# Patient Record
Sex: Female | Born: 1960 | Race: White | Hispanic: No | Marital: Married | Smoking: Never smoker
Health system: Southern US, Community
[De-identification: ages and names within clinical notes are randomized; demographics above are authoritative.]

## PROBLEM LIST (undated history)

## (undated) DIAGNOSIS — F329 Major depressive disorder, single episode, unspecified: Secondary | ICD-10-CM

## (undated) DIAGNOSIS — F32A Depression, unspecified: Secondary | ICD-10-CM

## (undated) DIAGNOSIS — F419 Anxiety disorder, unspecified: Secondary | ICD-10-CM

## (undated) DIAGNOSIS — J45909 Unspecified asthma, uncomplicated: Secondary | ICD-10-CM

## (undated) DIAGNOSIS — F4310 Post-traumatic stress disorder, unspecified: Secondary | ICD-10-CM

## (undated) HISTORY — DX: Unspecified asthma, uncomplicated: J45.909

## (undated) HISTORY — DX: Major depressive disorder, single episode, unspecified: F32.9

## (undated) HISTORY — DX: Post-traumatic stress disorder, unspecified: F43.10

## (undated) HISTORY — DX: Anxiety disorder, unspecified: F41.9

## (undated) HISTORY — DX: Depression, unspecified: F32.A

---

## 2019-01-25 ENCOUNTER — Other Ambulatory Visit: Payer: Self-pay | Admitting: Family Medicine

## 2019-01-25 ENCOUNTER — Other Ambulatory Visit (HOSPITAL_COMMUNITY): Payer: Self-pay | Admitting: Family Medicine

## 2019-01-25 DIAGNOSIS — R42 Dizziness and giddiness: Secondary | ICD-10-CM

## 2019-01-26 ENCOUNTER — Other Ambulatory Visit (HOSPITAL_COMMUNITY): Payer: Self-pay | Admitting: Family Medicine

## 2019-01-26 ENCOUNTER — Ambulatory Visit (HOSPITAL_COMMUNITY)
Admission: RE | Admit: 2019-01-26 | Discharge: 2019-01-26 | Disposition: A | Discharge status: Home / Self Care | Payer: BLUE CROSS/BLUE SHIELD | Source: Ambulatory Visit | Attending: Family Medicine | Admitting: Family Medicine

## 2019-01-26 ENCOUNTER — Other Ambulatory Visit: Payer: Self-pay

## 2019-01-26 ENCOUNTER — Other Ambulatory Visit: Payer: Self-pay | Admitting: Family Medicine

## 2019-01-26 DIAGNOSIS — R42 Dizziness and giddiness: Secondary | ICD-10-CM

## 2019-02-25 ENCOUNTER — Other Ambulatory Visit (HOSPITAL_COMMUNITY): Payer: Self-pay

## 2020-08-16 IMAGING — MR MRI HEAD WITHOUT CONTRAST
8 of 11 series · 24 of 48 positions shown · non-contrast
Comparison: None.

CLINICAL DATA: Bilateral weakness over the last 2 days.

EXAM:
MRI HEAD WITHOUT CONTRAST
MRA HEAD WITHOUT CONTRAST
TECHNIQUE: Multiplanar, multiecho pulse sequences of the brain and surrounding
structures were obtained without intravenous contrast. Angiographic
images of the head were obtained using MRA technique without
contrast.

[Series 2: DWI · axial · 3.0mm · 0.75mm/px · z∈[-82,+80]mm · 6 of 56 slices shown (1 of 2)]
[im 1/56]
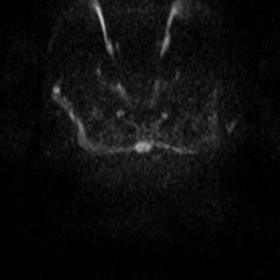
[im 12/56]
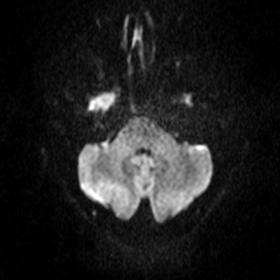
[im 23/56]
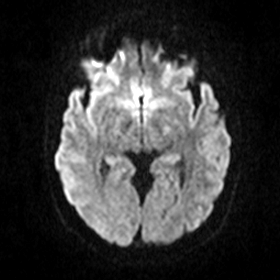
[im 34/56]
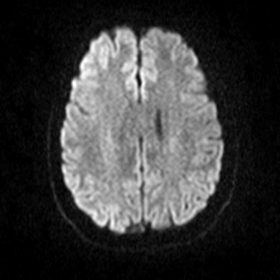
[im 45/56]
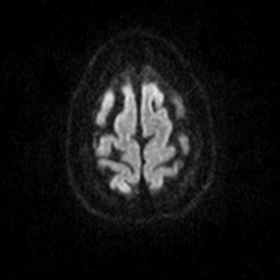
[im 56/56]
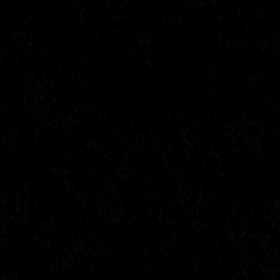

[Series 4: DWI · coronal · 5.0mm · 0.49mm/px · 3 of 39 slices shown (2 of 2)]
[im 1/39]
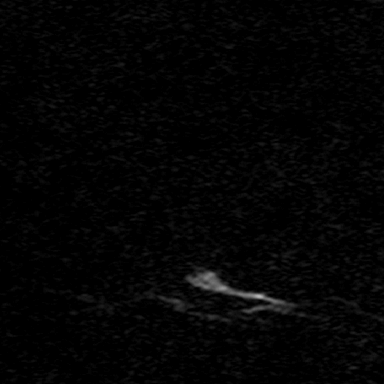
[im 20/39]
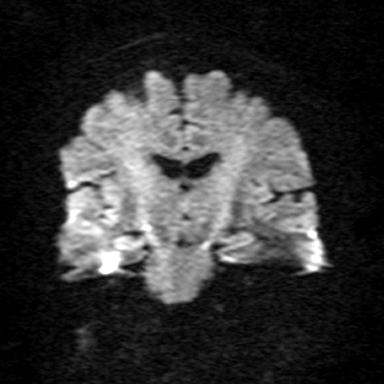
[im 39/39]
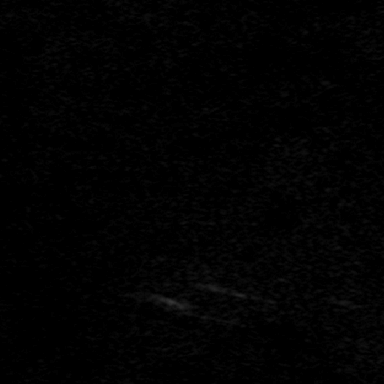

[Series 6: T1 · sagittal · 5.0mm · 0.40mm/px · 2 of 21 slices shown (1 of 2)]
[im 1/21]
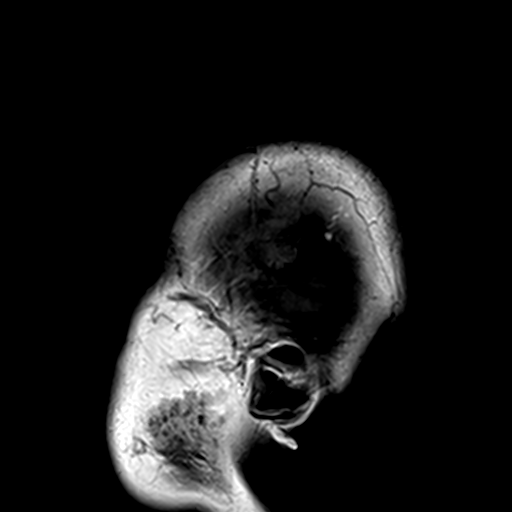
[im 21/21]
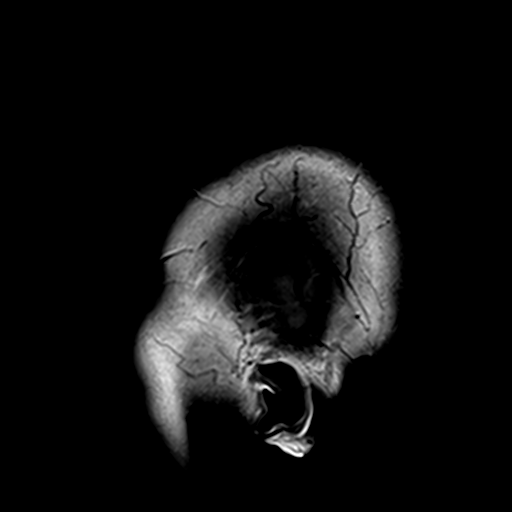

[Series 7: T2 · axial · 5.0mm · 0.48mm/px · z∈[-73,+70]mm · 2 of 23 slices shown (1 of 3)]
[im 1/23]
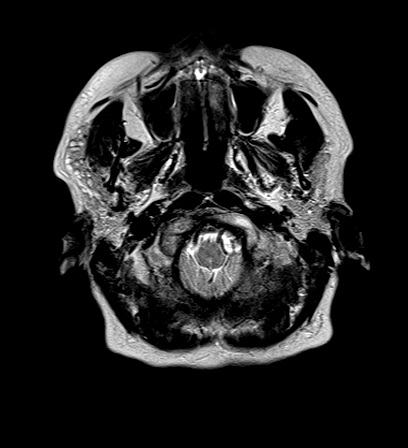
[im 23/23]
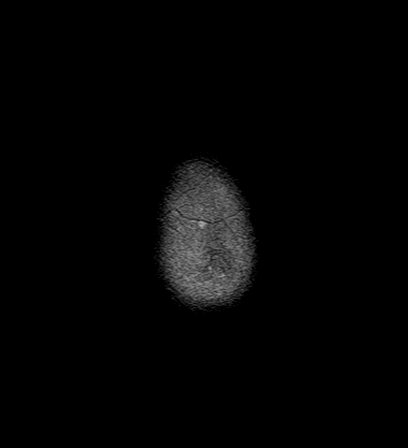

[Series 9: FLAIR · axial · 3.0mm · 0.32mm/px · z∈[-70,+68]mm · 4 of 47 slices shown]
[im 1/47]
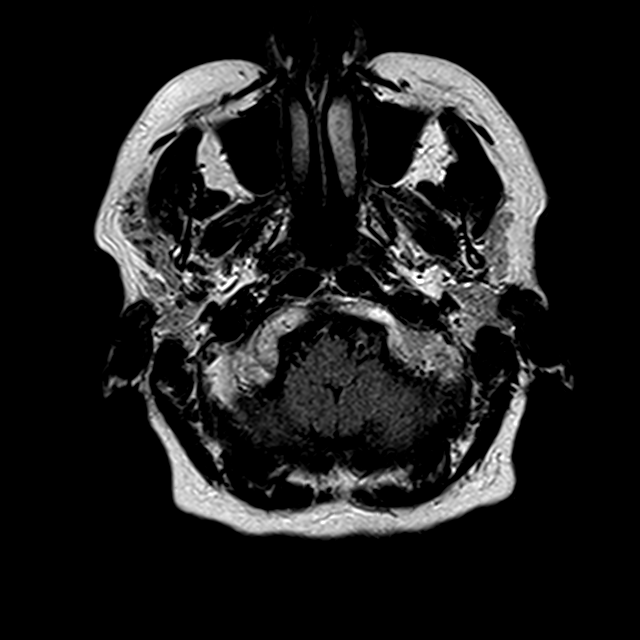
[im 16/47]
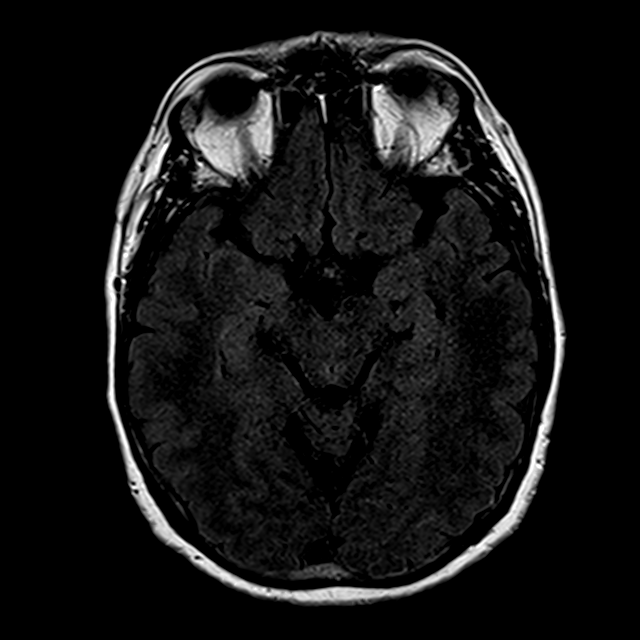
[im 31/47]
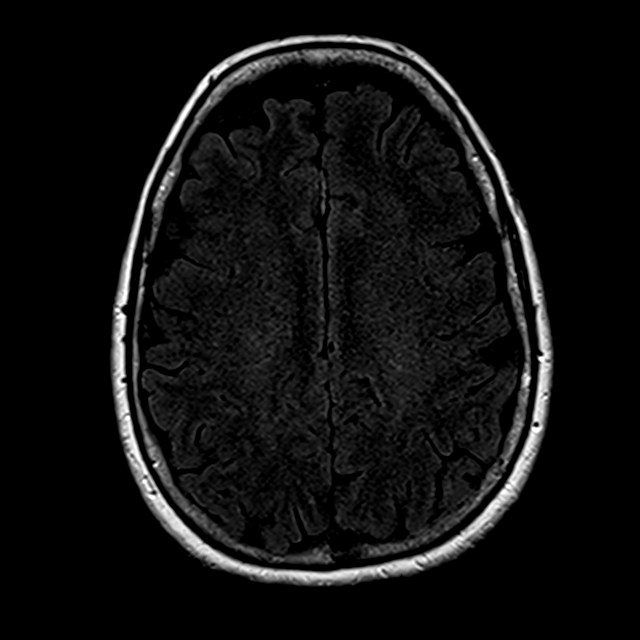
[im 47/47]
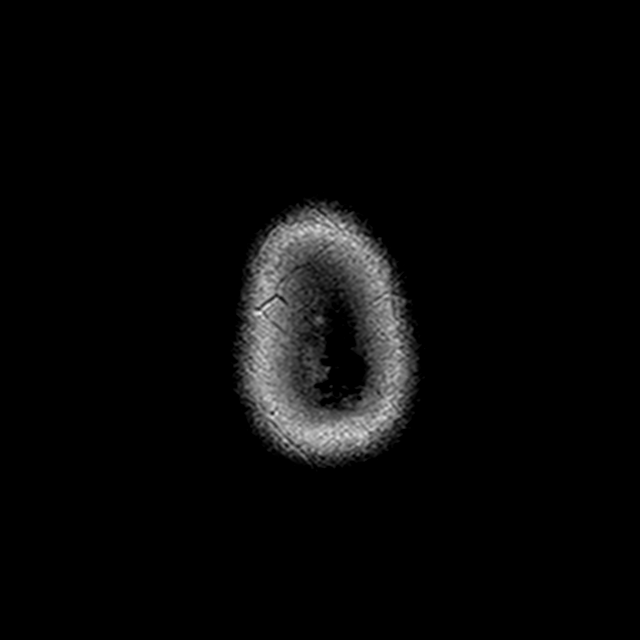

[Series 14: T1 · axial · 2.0mm · 0.41mm/px · z∈[-84,-32]mm · 3 of 80 slices shown (2 of 2)]
[im 1/80]
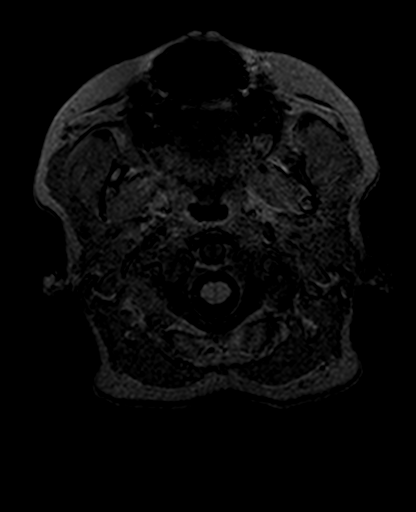
[im 14/80]
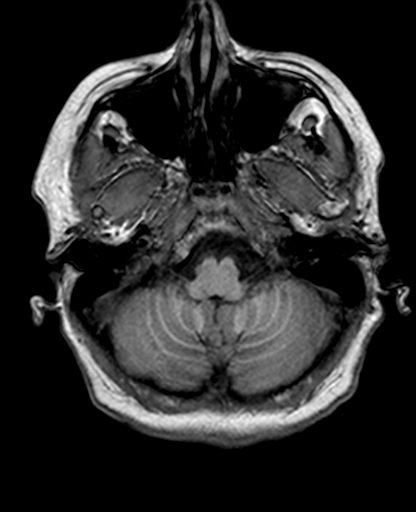
[im 27/80]
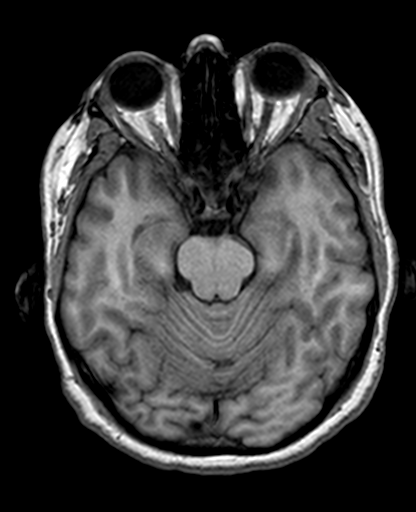

[Series 15: T2 · coronal · 5.0mm · 0.46mm/px · 2 of 28 slices shown (2 of 3)]
[im 1/28]
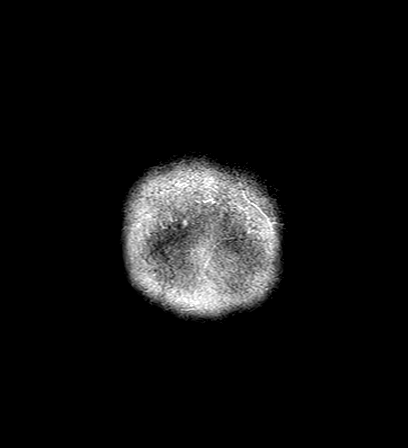
[im 28/28]
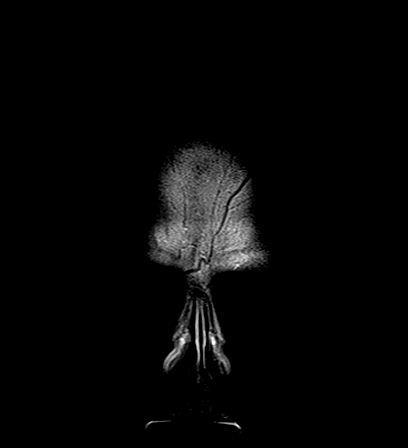

[Series 17: T2 · axial · 4.0mm · 0.42mm/px · z∈[-83,+47]mm · 2 of 27 slices shown (3 of 3)]
[im 1/27]
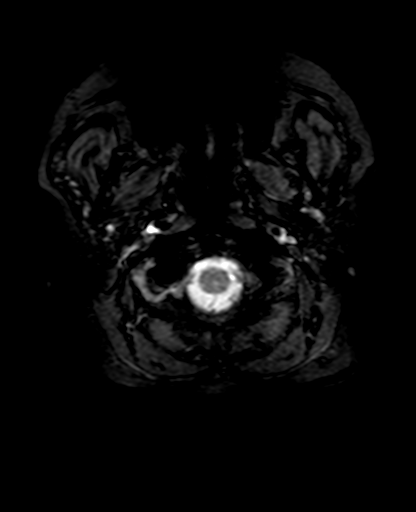
[im 27/27]
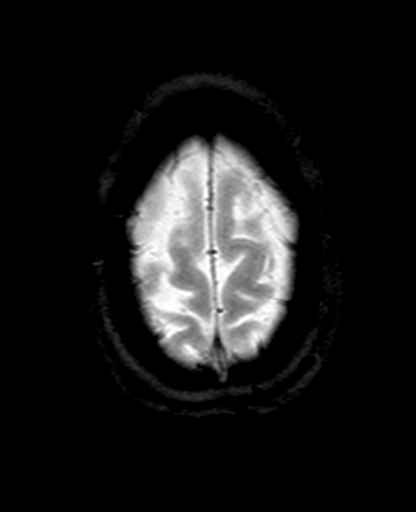

[24 of 48 positions shown; findings below may reference images not displayed]

FINDINGS: MRI HEAD FINDINGS

Brain: Diffusion imaging shows a 2 cm acute infarction of the left
posterior parietal cortical and subcortical brain. Evidence of mass
effect or hemorrhage. No other acute infarction. Brainstem and
cerebellum are normal. Cerebral hemispheres otherwise do not show
any old small or large vessel infarctions. No intra-axial mass
lesion. There is a 14 mm round meningioma at the left frontal
paramedian vertex. Slight indentation of the brain but no
significant mass effect or edema. No hydrocephalus or extra-axial
collection.

Vascular: Major vessels at the base of the brain show flow.

Skull and upper cervical spine: Negative

Sinuses/Orbits: Clear/normal

Other: None

MRA HEAD FINDINGS

Both internal carotid arteries are widely patent through the skull
base and siphon regions. The anterior and middle cerebral vessels
are patent without proximal stenosis, aneurysm or vascular
malformation. No missing large or medium vessels are seen.

Both vertebral arteries are patent to the basilar. No basilar
stenosis. Posterior circulation branch vessels show flow.
IMPRESSION: 2 cm acute infarction in the left posterior parietal/temporoparietal
junction region cortical and subcortical brain. No mass effect or
hemorrhage. Otherwise normal appearance of the brain parenchyma.
This is consistent with embolic infarction in the left MCA
territory.

1.4 cm meningioma of the left frontal paramedian vertex. Minimal
indentation of the brain but no significant mass effect.

Negative intracranial MR angiography of the large and medium size
vessels.

These results will be called to the ordering clinician or
representative by the Radiologist Assistant, and communication
documented in the PACS or zVision Dashboard.

## 2020-08-29 ENCOUNTER — Ambulatory Visit (INDEPENDENT_AMBULATORY_CARE_PROVIDER_SITE_OTHER): Payer: BC Managed Care – PPO | Admitting: Dermatology

## 2020-08-29 ENCOUNTER — Encounter: Payer: Self-pay | Admitting: Dermatology

## 2020-08-29 ENCOUNTER — Other Ambulatory Visit: Payer: Self-pay

## 2020-08-29 DIAGNOSIS — L720 Epidermal cyst: Secondary | ICD-10-CM

## 2020-08-29 DIAGNOSIS — Z808 Family history of malignant neoplasm of other organs or systems: Secondary | ICD-10-CM | POA: Diagnosis not present

## 2020-08-29 DIAGNOSIS — Z1283 Encounter for screening for malignant neoplasm of skin: Secondary | ICD-10-CM

## 2020-08-29 DIAGNOSIS — L918 Other hypertrophic disorders of the skin: Secondary | ICD-10-CM | POA: Diagnosis not present

## 2020-08-29 DIAGNOSIS — L821 Other seborrheic keratosis: Secondary | ICD-10-CM | POA: Diagnosis not present

## 2020-08-29 DIAGNOSIS — D1801 Hemangioma of skin and subcutaneous tissue: Secondary | ICD-10-CM

## 2020-08-29 NOTE — Patient Instructions (Addendum)
Routine follow-up for general skin check for Belinda Mays date of birth 01/25/1961.  Recently she has had multiple eye procedures related to her underlying connective tissue disease.  The only change in her skin has been a small dry area on the back of the thigh.  There is a family history of melanoma in dad.  Complete skin examination from feet to scalp showed no atypical moles, no melanoma, no nonmobile skin cancer. On the back of the left thigh and on the left scapula are tan textured 6 mm papules called seborrheic keratoses which are not moles and not skin cancer. Outside the right eye is a 1 mm skin tag and under the left eye is a white bump called a milium. These do not require watching her removal. Scattered everywhere are dozens of 2 to 3 mm red dots call cherry angiomas which are quite harmless. I discussed with [name] the low added risk of skin cancer in someone with her dad's history. We will schedule a follow-up in 1 year but I encouraged [name] to check her own skin with her husband twice annually. She knows she can call me if there is any problem in the interim.

## 2020-09-04 ENCOUNTER — Encounter: Payer: Self-pay | Admitting: Dermatology

## 2020-09-04 NOTE — Progress Notes (Signed)
   Follow-Up Visit   Subjective  Belinda Mays is a 59 y.o. female who presents for the following: Annual Exam (left post thigh scale).  Complete skin examination Location:  Duration:  Quality:  Associated Signs/Symptoms: Modifying Factors:  Severity:  Timing: Context:   Objective  Well appearing patient in no apparent distress; mood and affect are within normal limits. Objective  Chest - Medial [facility]): Patient stated father has +MM Patient has no history of skin cancer or atypical moles General skin examination showed no atypical moles, melanoma, nonmobile skin cancer.   Benign findings include the following: Right outer eye skin tag Left eye milia Angiomas all over  Under right breast keratoses  ( Clear skin exam )     Assessment & Plan    Screening exam for skin cancer Chest - Medial [facility])  Patient encouraged to examine her own skin twice annually, dermatologist yearly or as needed.  Routine follow-up for general skin check for Belinda Mays date of birth 1961/07/30.  Recently she has had multiple eye procedures related to her underlying connective tissue disease.  The only change in her skin has been a small dry area on the back of the thigh.  There is a family history of melanoma in dad.  Complete skin examination from feet to scalp showed no atypical moles, no melanoma, no nonmobile skin cancer. On the back of the left thigh and on the left scapula are tan textured 6 mm papules called seborrheic keratoses which are not moles and not skin cancer. Outside the right eye is a 1 mm skin tag and under the left eye is a white bump called a milium. These do not require watching her removal. Scattered everywhere are dozens of 2 to 3 mm red dots call cherry angiomas which are quite harmless. I discussed with Belinda Mays the low added risk of skin cancer in someone with her dad's history. We will schedule a follow-up in 1 year but I encouraged Belinda Mays to check her own skin  with her husband twice annually. She knows she can call me if there is any problem in the interim.   I, Lavonna Monarch, MD, have reviewed all documentation for this visit.  The documentation on 09/04/20 for the exam, diagnosis, procedures, and orders are all accurate and complete.

## 2020-11-07 ENCOUNTER — Ambulatory Visit: Payer: BLUE CROSS/BLUE SHIELD | Admitting: Dermatology

## 2022-09-02 ENCOUNTER — Ambulatory Visit: Payer: BC Managed Care – PPO | Admitting: Dermatology
